# Patient Record
Sex: Female | Born: 2000 | Race: White | Hispanic: No | Marital: Single
Health system: Southern US, Community
[De-identification: ages and names within clinical notes are randomized; demographics above are authoritative.]

## PROBLEM LIST (undated history)

## (undated) ENCOUNTER — Inpatient Hospital Stay (HOSPITAL_COMMUNITY): Payer: Self-pay

## (undated) DIAGNOSIS — D649 Anemia, unspecified: Secondary | ICD-10-CM

## (undated) HISTORY — PX: WISDOM TOOTH EXTRACTION: SHX21

---
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
## 2018-03-29 ENCOUNTER — Encounter (HOSPITAL_COMMUNITY): Payer: Self-pay
